# Patient Record
Sex: Female | Born: 1935 | Race: Black or African American | Hispanic: No | Marital: Married
Health system: Southern US, Community
[De-identification: ages and names within clinical notes are randomized; demographics above are authoritative.]

---

## 2012-02-28 ENCOUNTER — Other Ambulatory Visit: Payer: Self-pay | Admitting: Internal Medicine

## 2012-02-28 LAB — CBC WITH DIFFERENTIAL/PLATELET
Basophil #: 0.1 10*3/uL (ref 0.0–0.1)
Basophil %: 1.2 %
Eosinophil %: 0.7 %
HGB: 13.9 g/dL (ref 12.0–16.0)
Lymphocyte %: 42.4 %
MCH: 31.6 pg (ref 26.0–34.0)
MCHC: 33.4 g/dL (ref 32.0–36.0)

## 2012-02-28 LAB — BASIC METABOLIC PANEL
BUN: 9 mg/dL (ref 7–18)
Chloride: 109 mmol/L — ABNORMAL HIGH (ref 98–107)
Co2: 27 mmol/L (ref 21–32)
EGFR (Non-African Amer.): 49 — ABNORMAL LOW
Glucose: 90 mg/dL (ref 65–99)
Potassium: 3.5 mmol/L (ref 3.5–5.1)
Sodium: 145 mmol/L (ref 136–145)

## 2012-03-05 ENCOUNTER — Ambulatory Visit: Payer: Self-pay | Admitting: Internal Medicine

## 2015-09-07 ENCOUNTER — Other Ambulatory Visit: Payer: Self-pay | Admitting: Internal Medicine

## 2015-09-07 ENCOUNTER — Ambulatory Visit
Admission: RE | Admit: 2015-09-07 | Discharge: 2015-09-07 | Disposition: A | Discharge status: Home / Self Care | Payer: Medicare Other | Source: Ambulatory Visit | Attending: Internal Medicine | Admitting: Internal Medicine

## 2015-09-07 DIAGNOSIS — J90 Pleural effusion, not elsewhere classified: Secondary | ICD-10-CM | POA: Diagnosis not present

## 2015-09-07 DIAGNOSIS — I509 Heart failure, unspecified: Secondary | ICD-10-CM

## 2015-09-07 DIAGNOSIS — J9811 Atelectasis: Secondary | ICD-10-CM | POA: Diagnosis not present

## 2015-09-07 DIAGNOSIS — I2510 Atherosclerotic heart disease of native coronary artery without angina pectoris: Secondary | ICD-10-CM | POA: Insufficient documentation

## 2016-05-18 IMAGING — CR DG CHEST 2V
2 series · 2 of 2 positions shown · non-contrast
Comparison: None.

CLINICAL DATA: Shortness of breath for several weeks.

EXAM:
CHEST  2 VIEW

[chest pa]
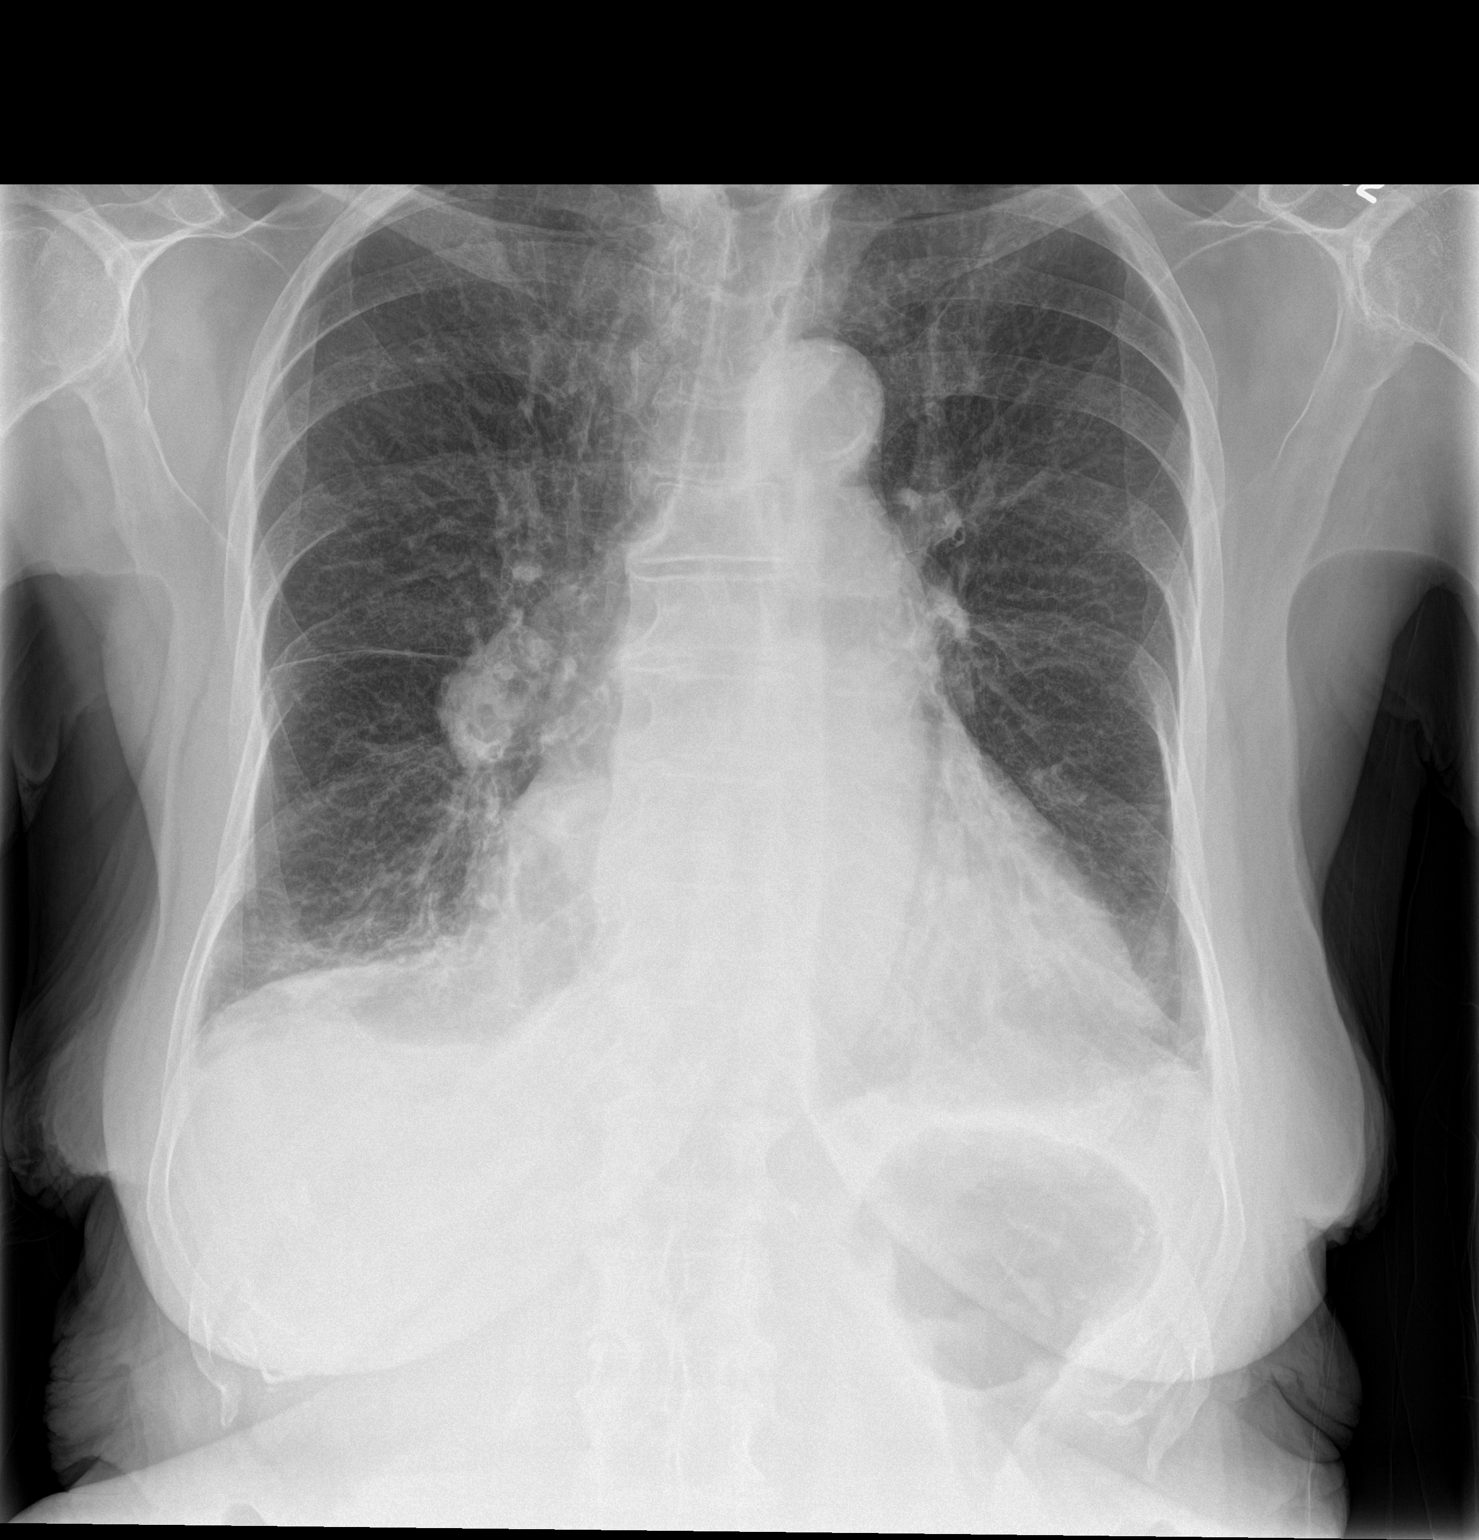

[chest lat]
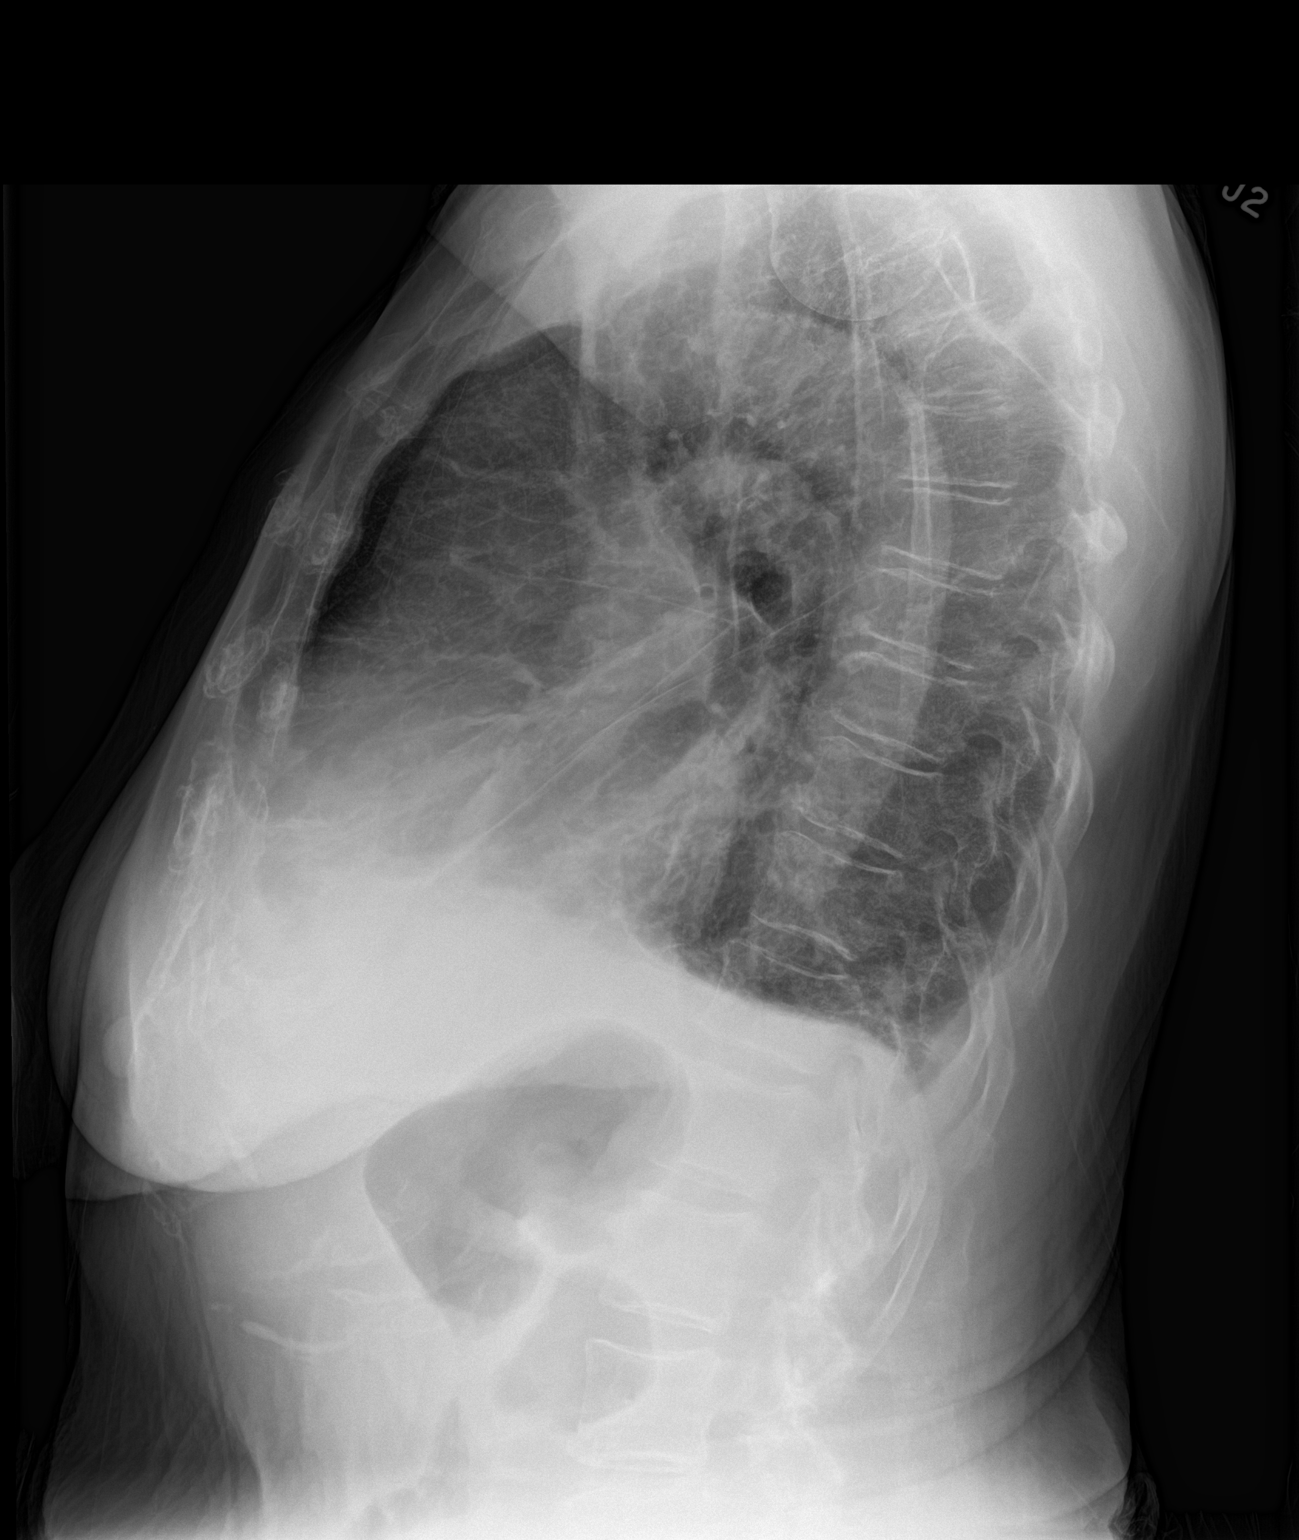

[2 of 2 positions shown; findings below may reference images not displayed]

FINDINGS: Mild cardiac enlargement. Aortic atherosclerosis noted. Small
bilateral pleural effusions and mild interstitial edema identified.
Atelectasis is noted in the lung bases.
IMPRESSION: 1. Mild CHF.

## 2020-02-24 ENCOUNTER — Telehealth: Payer: Self-pay

## 2020-02-24 NOTE — Telephone Encounter (Signed)
error 

## 2023-06-08 ENCOUNTER — Other Ambulatory Visit
Admission: RE | Admit: 2023-06-08 | Discharge: 2023-06-08 | Disposition: A | Discharge status: Home / Self Care | Payer: Medicare Other | Source: Ambulatory Visit | Attending: Nurse Practitioner | Admitting: Nurse Practitioner

## 2023-06-08 DIAGNOSIS — R600 Localized edema: Secondary | ICD-10-CM | POA: Diagnosis present

## 2023-06-08 DIAGNOSIS — I5022 Chronic systolic (congestive) heart failure: Secondary | ICD-10-CM | POA: Insufficient documentation

## 2023-06-08 LAB — BRAIN NATRIURETIC PEPTIDE: B Natriuretic Peptide: 712.2 pg/mL — ABNORMAL HIGH (ref 0.0–100.0)

## 2023-06-22 ENCOUNTER — Other Ambulatory Visit
Admission: RE | Admit: 2023-06-22 | Discharge: 2023-06-22 | Disposition: A | Discharge status: Home / Self Care | Payer: Medicare Other | Source: Ambulatory Visit | Attending: Nurse Practitioner | Admitting: Nurse Practitioner

## 2023-06-22 DIAGNOSIS — R7989 Other specified abnormal findings of blood chemistry: Secondary | ICD-10-CM | POA: Insufficient documentation

## 2023-06-22 DIAGNOSIS — I5022 Chronic systolic (congestive) heart failure: Secondary | ICD-10-CM | POA: Insufficient documentation

## 2023-06-22 LAB — BRAIN NATRIURETIC PEPTIDE: B Natriuretic Peptide: 451.4 pg/mL — ABNORMAL HIGH (ref 0.0–100.0)

## 2023-06-26 NOTE — Group Note (Deleted)
